# Patient Record
Sex: Male | Born: 1990 | Race: White | Hispanic: No | Marital: Single | Smoking: Former smoker
Health system: Southern US, Community
[De-identification: ages and names within clinical notes are randomized; demographics above are authoritative.]

## PROBLEM LIST (undated history)

## (undated) DIAGNOSIS — J45909 Unspecified asthma, uncomplicated: Secondary | ICD-10-CM

---
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
## 2013-04-11 ENCOUNTER — Encounter (HOSPITAL_COMMUNITY): Payer: Self-pay

## 2013-04-11 ENCOUNTER — Emergency Department (HOSPITAL_COMMUNITY)
Admission: EM | Admit: 2013-04-11 | Discharge: 2013-04-11 | Disposition: A | Discharge status: Home / Self Care | Payer: Medicaid Other | Attending: Emergency Medicine | Admitting: Emergency Medicine

## 2013-04-11 DIAGNOSIS — R51 Headache: Secondary | ICD-10-CM | POA: Insufficient documentation

## 2013-04-11 DIAGNOSIS — Z5900 Homelessness unspecified: Secondary | ICD-10-CM | POA: Insufficient documentation

## 2013-04-11 DIAGNOSIS — Y9389 Activity, other specified: Secondary | ICD-10-CM | POA: Insufficient documentation

## 2013-04-11 DIAGNOSIS — S90569A Insect bite (nonvenomous), unspecified ankle, initial encounter: Secondary | ICD-10-CM | POA: Insufficient documentation

## 2013-04-11 DIAGNOSIS — Y929 Unspecified place or not applicable: Secondary | ICD-10-CM | POA: Insufficient documentation

## 2013-04-11 DIAGNOSIS — J45909 Unspecified asthma, uncomplicated: Secondary | ICD-10-CM | POA: Insufficient documentation

## 2013-04-11 DIAGNOSIS — H538 Other visual disturbances: Secondary | ICD-10-CM | POA: Insufficient documentation

## 2013-04-11 DIAGNOSIS — R110 Nausea: Secondary | ICD-10-CM | POA: Insufficient documentation

## 2013-04-11 DIAGNOSIS — R42 Dizziness and giddiness: Secondary | ICD-10-CM | POA: Insufficient documentation

## 2013-04-11 HISTORY — DX: Unspecified asthma, uncomplicated: J45.909

## 2013-04-11 MED ORDER — METOCLOPRAMIDE HCL 5 MG/ML IJ SOLN
10.0000 mg | Freq: Once | INTRAMUSCULAR | Status: AC
  Administered 2013-04-11: 10 mg via INTRAMUSCULAR
  Filled 2013-04-11: qty 2

## 2013-04-11 MED ORDER — DIPHENHYDRAMINE HCL 50 MG/ML IJ SOLN
25.0000 mg | Freq: Once | INTRAMUSCULAR | Status: AC
  Administered 2013-04-11: 25 mg via INTRAMUSCULAR
  Filled 2013-04-11: qty 1

## 2013-04-11 NOTE — ED Provider Notes (Signed)
Medical screening examination/treatment/procedure(s) were performed by non-physician practitioner and as supervising physician I was immediately available for consultation/collaboration.  Layla Maw Jobany Montellano, DO 04/11/13 2233

## 2013-04-11 NOTE — ED Provider Notes (Signed)
CSN: [id number]     Arrival date & time 04/11/13  1818 History     First MD Initiated Contact with Patient 04/11/13 1920     Chief Complaint  Patient presents with  . Headache  . Insect Bite   (Consider location/radiation/quality/duration/timing/severity/associated sxs/prior Treatment) HPI Comments: Patient is a 22 year old male presented to the emergency department for multiple complaints. Patient's first complaint is a headache that has been getting progressively worse over the last 2 days. He states it is throbbing in nature on the left side with no alleviating or hurting factors. Patient endorses associated blurred vision and nausea. Patient states he has not had anything to eat or drink the last 2 days and is homeless. Patient states he does typically get headaches with these associated symptoms but not as severe. Patient's second complaint is multiple bug bites on his lower extremities. He states they're itchy without drainage or warmth. He believes that they are mosquito bites but is unsure. Patient denies fevers or chills, syncope, numbness or tingling.  Patient is a 22 y.o. male presenting with headaches.  Headache Associated symptoms: no fever, no neck pain, no neck stiffness and no numbness     Past Medical History  Diagnosis Date  . Asthma    History reviewed. No pertinent past surgical history. No family history on file. History  Substance Use Topics  . Smoking status: Not on file  . Smokeless tobacco: Not on file  . Alcohol Use: Not on file    Review of Systems  Constitutional: Negative for fever and chills.  HENT: Negative for facial swelling, neck pain and neck stiffness.   Respiratory: Negative for shortness of breath.   Cardiovascular: Negative for chest pain.  Genitourinary: Negative.   Musculoskeletal: Negative.   Skin:       Bug bites  Neurological: Positive for light-headedness and headaches. Negative for syncope, weakness and numbness.    Allergies   Dye fdc red  Home Medications  No current outpatient prescriptions on file. BP 121/69  Pulse 74  Temp(Src) 99.1 F (37.3 C) (Oral)  Resp 16  SpO2 99% Physical Exam  Constitutional: He is oriented to person, place, and time. He appears well-developed and well-nourished. No distress.  Patient sleeping upon my entrance into the room.  HENT:  Head: Normocephalic and atraumatic.  Right Ear: External ear normal.  Left Ear: External ear normal.  Nose: Nose normal.  Mouth/Throat: Oropharynx is clear and moist.  Eyes: Conjunctivae and EOM are normal. Pupils are equal, round, and reactive to light.  Neck: Normal range of motion. Neck supple.  Cardiovascular: Normal rate, regular rhythm, normal heart sounds and intact distal pulses.   Pulmonary/Chest: Effort normal and breath sounds normal. No respiratory distress.  Abdominal: Soft. There is no tenderness.  Musculoskeletal: Normal range of motion.  Lymphadenopathy:    He has no cervical adenopathy.  Neurological: He is alert and oriented to person, place, and time. He has normal strength. No cranial nerve deficit or sensory deficit. Gait normal. GCS eye subscore is 4. GCS verbal subscore is 5. GCS motor subscore is 6.  Skin: Skin is warm and dry. He is not diaphoretic.  Multiple erythematous spots on LE consistent with bug bites, no warmth or surrounding erythema. No drainage    ED Course   Procedures (including critical care time)  Medications  metoCLOPramide (REGLAN) injection 10 mg (10 mg Intramuscular Given 04/11/13 1939)  diphenhydrAMINE (BENADRYL) injection 25 mg (25 mg Intramuscular Given 04/11/13 1939)  Labs Reviewed - No data to display No results found. 1. Headache     MDM  NAD, non-toxic AAOx4. Presentation non concerning for [facility], ICH, Meningitis, or temporal arteritis. Pt is afebrile with no focal neuro deficits, nuchal rigidity, or change in vision. Pt given Benadryl and Reglan for HA. Benadryl also used for  pruritic bug bites, areas non-concerning for cellulitis or acute emergent rash. Pt just given HA cocktail at shift change. Care transferred to [facility] for follow up. Patient stable at time of shift change.   Jeannetta Ellis, PA-C 04/12/13 1512

## 2013-04-11 NOTE — ED Notes (Signed)
Pt has bug bites to ankles on both legs. Pt states he thinks they are mosquito bites. Pt also states he has a headache and slight nausea for the past 2 days. Pt denies vomiting. Pt a/o x 4. Pt with no acute distress. Pt ambulatory to exam room with steady gait.

## 2013-04-11 NOTE — ED Notes (Signed)
Per EMS pt c/o headache and a bug bite, pt homeless sleeps in woods; no distress

## 2013-04-11 NOTE — ED Provider Notes (Signed)
Pt received from Piepenbrink, PA-C.  21yo healthy but homeless 21yo M presented to ED w/ c/o pruritic insect bites, that appear to be from mosquitoes, and non-traumatic headache x 2d.  He received IM reglan and benadryl and reports that his symptoms are much improved.  Will d/c home with resources for homeless shelters.  Return precautions discussed.  9:11 PM   Otilio Miu, PA-C 04/11/13 2112

## 2013-04-12 NOTE — ED Provider Notes (Signed)
Medical screening examination/treatment/procedure(s) were performed by non-physician practitioner and as supervising physician I was immediately available for consultation/collaboration.  Maryanna Stuber N Paolina Karwowski, DO 04/12/13 2339 

## 2019-05-06 ENCOUNTER — Encounter (HOSPITAL_COMMUNITY): Payer: Self-pay | Admitting: Emergency Medicine

## 2019-05-06 ENCOUNTER — Other Ambulatory Visit: Payer: Self-pay

## 2019-05-06 ENCOUNTER — Emergency Department (HOSPITAL_COMMUNITY): Payer: Self-pay

## 2019-05-06 ENCOUNTER — Emergency Department (HOSPITAL_COMMUNITY)
Admission: EM | Admit: 2019-05-06 | Discharge: 2019-05-07 | Disposition: A | Discharge status: Home / Self Care | Payer: Self-pay | Attending: Emergency Medicine | Admitting: Emergency Medicine

## 2019-05-06 DIAGNOSIS — J45909 Unspecified asthma, uncomplicated: Secondary | ICD-10-CM | POA: Insufficient documentation

## 2019-05-06 DIAGNOSIS — R112 Nausea with vomiting, unspecified: Secondary | ICD-10-CM | POA: Insufficient documentation

## 2019-05-06 DIAGNOSIS — R079 Chest pain, unspecified: Secondary | ICD-10-CM | POA: Insufficient documentation

## 2019-05-06 DIAGNOSIS — R202 Paresthesia of skin: Secondary | ICD-10-CM | POA: Insufficient documentation

## 2019-05-06 DIAGNOSIS — Z87891 Personal history of nicotine dependence: Secondary | ICD-10-CM | POA: Insufficient documentation

## 2019-05-06 LAB — URINALYSIS, ROUTINE W REFLEX MICROSCOPIC
Bilirubin Urine: NEGATIVE
Glucose, UA: NEGATIVE mg/dL
Hgb urine dipstick: NEGATIVE
Ketones, ur: NEGATIVE mg/dL
Leukocytes,Ua: NEGATIVE
Nitrite: NEGATIVE
Protein, ur: NEGATIVE mg/dL
Specific Gravity, Urine: 1.003 — ABNORMAL LOW (ref 1.005–1.030)
pH: 6 (ref 5.0–8.0)

## 2019-05-06 LAB — COMPREHENSIVE METABOLIC PANEL
ALT: 47 U/L — ABNORMAL HIGH (ref 0–44)
AST: 29 U/L (ref 15–41)
Albumin: 4.4 g/dL (ref 3.5–5.0)
Alkaline Phosphatase: 90 U/L (ref 38–126)
Anion gap: 9 (ref 5–15)
BUN: 5 mg/dL — ABNORMAL LOW (ref 6–20)
CO2: 24 mmol/L (ref 22–32)
Calcium: 9.3 mg/dL (ref 8.9–10.3)
Chloride: 101 mmol/L (ref 98–111)
Creatinine, Ser: 0.94 mg/dL (ref 0.61–1.24)
GFR calc Af Amer: >60 mL/min (ref >60)
GFR calc non Af Amer: >60 mL/min (ref >60)
Glucose, Bld: 113 mg/dL — ABNORMAL HIGH (ref 70–99)
Potassium: 3.4 mmol/L — ABNORMAL LOW (ref 3.5–5.1)
Sodium: 134 mmol/L — ABNORMAL LOW (ref 135–145)
Total Bilirubin: 0.6 mg/dL (ref 0.3–1.2)
Total Protein: 7.3 g/dL (ref 6.5–8.1)

## 2019-05-06 LAB — CBC
HCT: 48.4 % (ref 39.0–52.0)
Hemoglobin: 15.9 g/dL (ref 13.0–17.0)
MCH: 27.9 pg (ref 26.0–34.0)
MCHC: 32.9 g/dL (ref 30.0–36.0)
MCV: 84.9 fL (ref 80.0–100.0)
Platelets: 344 10*3/uL (ref 150–400)
RBC: 5.7 MIL/uL (ref 4.22–5.81)
RDW: 12.5 % (ref 11.5–15.5)
WBC: 16.9 10*3/uL — ABNORMAL HIGH (ref 4.0–10.5)
nRBC: 0 % (ref 0.0–0.2)

## 2019-05-06 LAB — TROPONIN I (HIGH SENSITIVITY)
Troponin I (High Sensitivity): 4 ng/L (ref <18)
Troponin I (High Sensitivity): 3 ng/L (ref <18)

## 2019-05-06 LAB — LIPASE, BLOOD: Lipase: 24 U/L (ref 11–51)

## 2019-05-06 LAB — D-DIMER, QUANTITATIVE: D-Dimer, Quant: <0.27 ug/mL-FEU (ref 0.00–0.50)

## 2019-05-06 MED ORDER — SODIUM CHLORIDE 0.9 % IV BOLUS
1000.0000 mL | Freq: Once | INTRAVENOUS | Status: AC
  Administered 2019-05-06: 1000 mL via INTRAVENOUS

## 2019-05-06 MED ORDER — SODIUM CHLORIDE 0.9% FLUSH: 3.0000 mL | Freq: Once | INTRAVENOUS | Status: DC

## 2019-05-06 NOTE — ED Provider Notes (Signed)
[facility]   CSN: [id number] Arrival date & time: 05/06/19  2021     History   Chief Complaint Chief Complaint  Patient presents with  . Chest Pain  . Abdominal Pain    HPI Nicholas Chang is a 28 y.o. male.   HPI   28yM with CP. Onset this afternoon while at rest. Pain in the center of his chest. Doesn't radiate. Improved since onset but not completely resolved. Associated with n/v and tingling in both hands. No cough. No fever or chills. No unusual leg pain or swelling.   Past Medical History:  Diagnosis Date  . Asthma     There are no active problems to display for this patient.   History reviewed. No pertinent surgical history.    Home Medications    Prior to Admission medications   Medication Sig Start Date End Date Taking? Authorizing Provider  naproxen sodium (ALEVE) 220 MG tablet Take 440 mg by mouth 3 (three) times daily as needed (pain).   Yes [provider]   Family History No family history on file.  Social History Social History   Tobacco Use  . Smoking status: Former Games developermoker  . Smokeless tobacco: Never Used  Substance Use Topics  . Alcohol use: Never    Frequency: Never  . Drug use: Never   Allergies   Dye fdc red [red dye] and Ginger  Review of Systems Review of Systems  All systems reviewed and negative, other than as noted in HPI.  Physical Exam Updated Vital Signs BP 116/81 (BP Location: Right Arm)   Pulse 98   Temp 98.6 F (37 C) (Oral)   Resp 16   Ht 5\' 11"  (1.803 m)   Wt 104.3 kg   SpO2 100%   BMI 32.08 kg/m   Physical Exam Vitals signs and nursing note reviewed.  Constitutional:      General: He is not in acute distress.    Appearance: He is well-developed. He is obese.  HENT:     Head: Normocephalic and atraumatic.  Eyes:     General:        Right eye: No discharge.        Left eye: No discharge.     Conjunctiva/sclera: Conjunctivae normal.   Neck:     Musculoskeletal: Neck supple.  Cardiovascular:     Rate and Rhythm: Normal rate and regular rhythm.     Heart sounds: Normal heart sounds. No murmur. No friction rub. No gallop.   Pulmonary:     Effort: Pulmonary effort is normal. No respiratory distress.     Breath sounds: Normal breath sounds.  Abdominal:     General: There is no distension.     Palpations: Abdomen is soft.     Tenderness: There is no abdominal tenderness.  Musculoskeletal:        General: No tenderness.     Comments: Lower extremities symmetric as compared to each other. No calf tenderness. Negative Homan's. No palpable cords.   Skin:    General: Skin is warm and dry.  Neurological:     Mental Status: He is alert.  Psychiatric:        Behavior: Behavior normal.        Thought Content: Thought content normal.    ED Treatments / Results  Labs (all labs ordered are listed, but only abnormal results are displayed) Labs Reviewed  COMPREHENSIVE METABOLIC PANEL - Abnormal; Notable for the following components:  Result Value   Sodium 134 (*)    Potassium 3.4 (*)    Glucose, Bld 113 (*)    BUN 5 (*)    ALT 47 (*)    All other components within normal limits  CBC - Abnormal; Notable for the following components:   WBC 16.9 (*)    All other components within normal limits  URINALYSIS, ROUTINE W REFLEX MICROSCOPIC - Abnormal; Notable for the following components:   Color, Urine STRAW (*)    Specific Gravity, Urine 1.003 (*)    All other components within normal limits  LIPASE, BLOOD  D-DIMER, QUANTITATIVE (NOT AT [facility])  TROPONIN I (HIGH SENSITIVITY)  TROPONIN I (HIGH SENSITIVITY)    EKG EKG Interpretation  Date/Time:  Sunday May 06 2019 21:50:19 EDT Ventricular Rate:  101 PR Interval:    QRS Duration: 89 QT Interval:  339 QTC Calculation: 440 R Axis:   76 Text Interpretation:  Sinus tachycardia Confirmed by Tyteanna Ost ([id number]) on 05/06/2019 10:27:40 PM   Radiology Dg Chest  2 View  Result Date: 05/06/2019 CLINICAL DATA:  Chest pain EXAM: CHEST - 2 VIEW COMPARISON:  02/18/2008 FINDINGS: The heart size and mediastinal contours are within normal limits. Both lungs are clear. The visualized skeletal structures are unremarkable. IMPRESSION: No active cardiopulmonary disease. Electronically Signed   By: Kim  Fujinaga M.D.   On: 05/06/2019 20:41   Procedures Procedures (including critical care time)  Medications Ordered in ED Medications  sodium chloride flush (NS) 0.9 % injection 3 mL (has no administration in time range)  sodium chloride 0.9 % bolus 1,000 mL (1,000 mLs Intravenous New Bag/Given 05/06/19 2254)    Initial Impression / Assessment and Plan / ED Course  I have reviewed the triage vital signs and the nursing notes.  Pertinent labs & imaging results that were available during my care of the patient were reviewed by me and considered in my medical decision making (see chart for details).     28 yM with CP. Seems atypical for ACS. Suspect may be some component of anxiety. Resting tachycardia. Will obtain d-dimer. CTa if positive. Otherwise I doubt emergent process.   Final Clinical Impressions(s) / ED Diagnoses   Final diagnoses:  Chest pain, unspecified type    ED Discharge Orders    None       Virgel Manifold, MD 05/08/19 [phone]

## 2019-05-06 NOTE — ED Notes (Signed)
Pt ambulated to restroom with no issues.  

## 2019-05-06 NOTE — ED Triage Notes (Addendum)
Pt to triage via [facility]> C/o substernal chest pressure, hands tingling, nausea, vomiting, and diarrhea x 6 hours.  Pain seemed to move more into abd upon arrival to ED.  Denies fever.  No change in pain with movement or palpation.  Zofran 4mg  IV, ASA 324mg , NTG SL x 1 that changed pain from a 6 to 3.  NS 250 bolus.

## 2019-05-06 NOTE — ED Notes (Signed)
ED Provider at bedside. 

## 2019-05-07 NOTE — ED Notes (Signed)
ED Provider at bedside. 

## 2019-05-07 NOTE — ED Provider Notes (Signed)
Patient signed out to me by Dr. Wilson Singer to follow-up on d-dimer.  Patient seen with chest and abdominal discomfort earlier that was felt to be likely secondary to anxiety.  His cardiac evaluation has been negative.  His d-dimer was also normal.  He is no longer tachycardic, heart rate is now in the 90s with normal blood pressure.  Patient does admit to a history of anxiety and panic attack.  Additionally he drank 3 cups of coffee this morning and had not had any caffeine or coffee for a very long time prior to that.  He thinks that it caused him to have some reflux and palpitations that caused his anxiety.  He is feeling back to his normal baseline now, appropriate for discharge.   Orpah Greek, MD 05/07/19 [phone]

## 2019-10-29 ENCOUNTER — Encounter (HOSPITAL_BASED_OUTPATIENT_CLINIC_OR_DEPARTMENT_OTHER): Payer: Self-pay | Admitting: *Deleted

## 2019-10-29 ENCOUNTER — Emergency Department (HOSPITAL_BASED_OUTPATIENT_CLINIC_OR_DEPARTMENT_OTHER)
Admission: EM | Admit: 2019-10-29 | Discharge: 2019-10-29 | Disposition: A | Discharge status: Home / Self Care | Payer: Self-pay | Attending: Emergency Medicine | Admitting: Emergency Medicine

## 2019-10-29 ENCOUNTER — Other Ambulatory Visit: Payer: Self-pay

## 2019-10-29 DIAGNOSIS — J45909 Unspecified asthma, uncomplicated: Secondary | ICD-10-CM | POA: Insufficient documentation

## 2019-10-29 DIAGNOSIS — K029 Dental caries, unspecified: Secondary | ICD-10-CM | POA: Insufficient documentation

## 2019-10-29 DIAGNOSIS — K0889 Other specified disorders of teeth and supporting structures: Secondary | ICD-10-CM | POA: Insufficient documentation

## 2019-10-29 DIAGNOSIS — Z87891 Personal history of nicotine dependence: Secondary | ICD-10-CM | POA: Insufficient documentation

## 2019-10-29 DIAGNOSIS — Z91018 Allergy to other foods: Secondary | ICD-10-CM | POA: Insufficient documentation

## 2019-10-29 MED ORDER — PENICILLIN V POTASSIUM 500 MG PO TABS: 500.0000 mg | ORAL_TABLET | Freq: Four times a day (QID) | ORAL | 0 refills | Status: AC

## 2019-10-29 NOTE — ED Provider Notes (Signed)
[facility] EMERGENCY DEPARTMENT Provider Note   CSN: [id number] Arrival date & time: 10/29/19  1515     History Chief Complaint  Patient presents with  . Dental Pain    Nicholas Chang is a 29 y.o. male.  HPI 29 year old Caucasian male with no pertinent past medical history presents to the ER for evaluation of dental pain.  Patient reports pain to the upper right side of his mouth for the past several days.  Patient states he does not have a dentist due to insurance.  Patient denies any difficulties breathing or swallowing.  No fevers or chills.  He has taken over-the-counter ibuprofen and Tylenol for pain without any significant relief.  Patient has also taken Orajel without any relief.  No alleviating or aggravating factors.    Past Medical History:  Diagnosis Date  . Asthma     There are no problems to display for this patient.   No past surgical history on file.     No family history on file.  Social History   Tobacco Use  . Smoking status: Former Research scientist (life sciences)  . Smokeless tobacco: Never Used  Substance Use Topics  . Alcohol use: Yes  . Drug use: Never    Home Medications Prior to Admission medications   Medication Sig Start Date End Date Taking? Authorizing Provider  naproxen sodium (ALEVE) 220 MG tablet Take 440 mg by mouth 3 (three) times daily as needed (pain).    [provider]  penicillin v potassium (VEETID) 500 MG tablet Take 1 tablet (500 mg total) by mouth 4 (four) times daily for 7 days. 10/29/19 11/05/19  Doristine Devoid, PA-C    Allergies    Dye fdc red [red dye] and Ginger  Review of Systems   Review of Systems  Constitutional: Negative for chills and fever.  HENT: Positive for dental problem. Negative for trouble swallowing.   Eyes: Negative for discharge.  Respiratory: Negative for cough.   Neurological: Negative for headaches.  Psychiatric/Behavioral: Negative for confusion.    Physical Exam Updated Vital Signs BP  (!) 147/81 (BP Location: Left Arm)   Pulse 82   Temp 98.7 F (37.1 C) (Oral)   Resp 19   Ht 5\' 11"  (1.803 m)   Wt 104.3 kg   SpO2 100%   BMI 32.08 kg/m   Physical Exam Vitals and nursing note reviewed.  Constitutional:      General: He is not in acute distress.    Appearance: He is well-developed.  HENT:     Head: Normocephalic and atraumatic.     Mouth/Throat:     Mouth: Mucous membranes are moist.     Pharynx: Oropharynx is clear. No posterior oropharyngeal erythema.     Comments: Poor dentition throughout with several dental caries.  Patient does have dental carry to the back right upper molar.  There is no gross abscess for drainage.  The gingiva is mildly irritated and edematous but no erythema.  There is no associated drainage appreciated.  Patient oropharynx is clear.  Uvula midline.  No sublingual or submandibular swelling.  No muffled voice or drooling. Eyes:     General: No scleral icterus.       Right eye: No discharge.        Left eye: No discharge.  Pulmonary:     Effort: No respiratory distress.  Musculoskeletal:        General: Normal range of motion.     Cervical back: Normal range of motion.  Lymphadenopathy:     Cervical: No cervical adenopathy.  Skin:    Coloration: Skin is not pale.  Neurological:     Mental Status: He is alert.  Psychiatric:        Behavior: Behavior normal.        Thought Content: Thought content normal.        Judgment: Judgment normal.     ED Results / Procedures / Treatments   Labs (all labs ordered are listed, but only abnormal results are displayed) Labs Reviewed - No data to display  EKG None  Radiology No results found.  Procedures Dental Block  Date/Time: 10/29/2019 5:49 PM Performed by: Rise Mu, PA-C Authorized by: Rise Mu, PA-C   Consent:    Consent obtained:  Verbal   Consent given by:  Patient   Alternatives discussed:  No treatment Indications:    Indications: dental pain     Procedure details (see MAR for exact dosages):    Needle gauge:  27 G   Anesthetic injected:  Bupivacaine 0.25% WITH epi   Injection procedure:  Anatomic landmarks identified, introduced needle, incremental injection, negative aspiration for blood and anatomic landmarks palpated Post-procedure details:    Outcome:  Pain relieved   Patient tolerance of procedure:  Tolerated well, no immediate complications   (including critical care time)  Medications Ordered in ED Medications - No data to display  ED Course  I have reviewed the triage vital signs and the nursing notes.  Pertinent labs & imaging results that were available during my care of the patient were reviewed by me and considered in my medical decision making (see chart for details).    MDM Rules/Calculators/A&P                      Patient with toothache.  No gross abscess.  Exam unconcerning for Ludwig's angina or spread of infection.  Will treat with penicillin and pain medicine.  Urged patient to follow-up with dentist.    Final Clinical Impression(s) / ED Diagnoses Final diagnoses:  Pain, dental    Rx / DC Orders ED Discharge Orders         Ordered    penicillin v potassium (VEETID) 500 MG tablet  4 times daily     10/29/19 1640           Rise Mu, PA-C 10/29/19 1750    Pricilla Loveless, MD 11/01/19 1547

## 2019-10-29 NOTE — ED Triage Notes (Signed)
Pt c/o of upper right side dental pain, hx of dental issues due to lack of dental care.

## 2019-10-29 NOTE — Discharge Instructions (Addendum)

## 2019-10-29 NOTE — ED Notes (Signed)
Rt back top tooth has  Been hurting and broken  Longer than 2 months pt states , , has other teeth that are bad

## 2019-10-29 NOTE — ED Notes (Signed)
Pt has taken naproxen/tylenol q 6 hrs and 1 tube of orael daily x 1 week

## 2019-11-23 IMAGING — DX DG CHEST 2V
2 series · 2 of 2 positions shown · non-contrast
Comparison: 02/18/2008

CLINICAL DATA: Chest pain

EXAM:
CHEST - 2 VIEW

[chest pa]
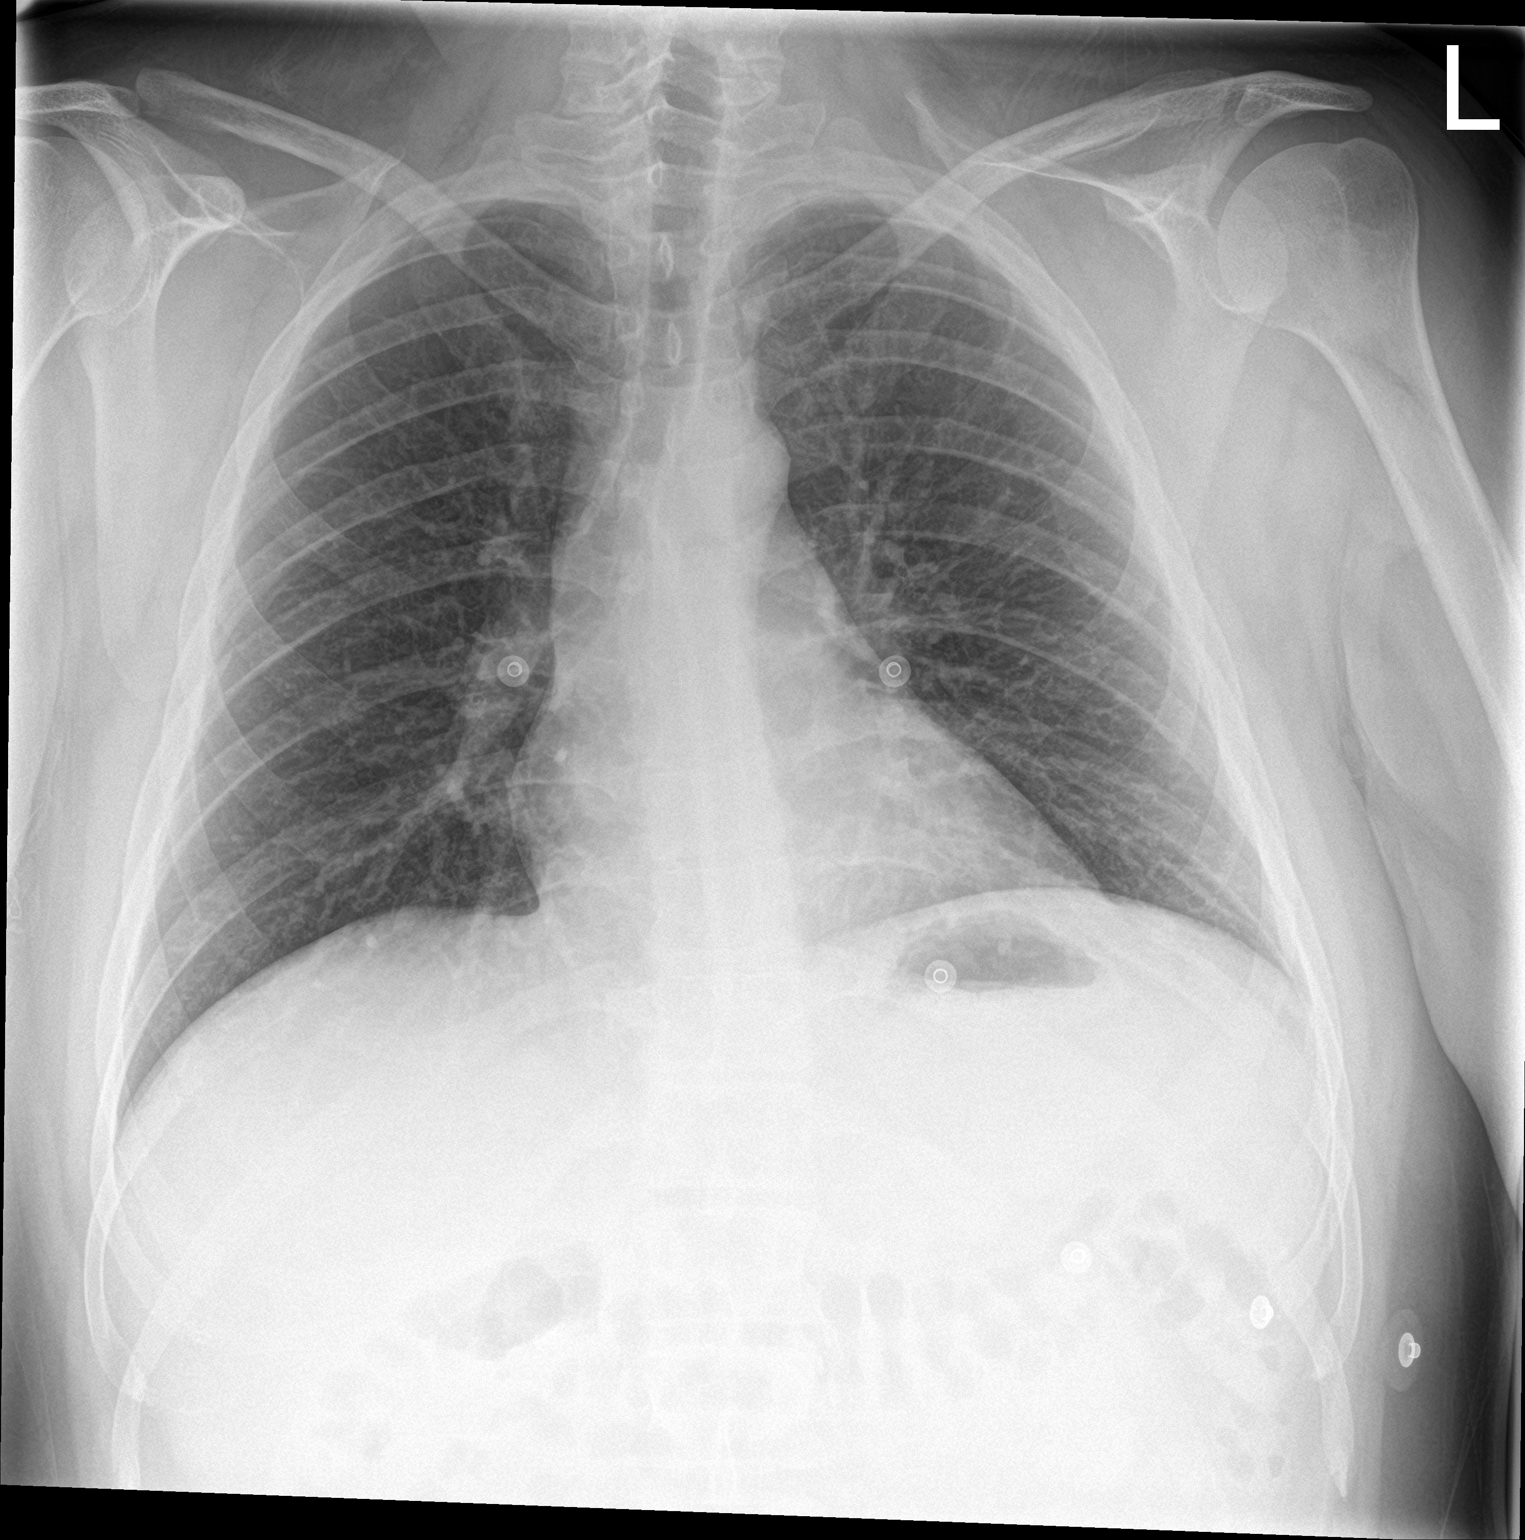

[chest lat]
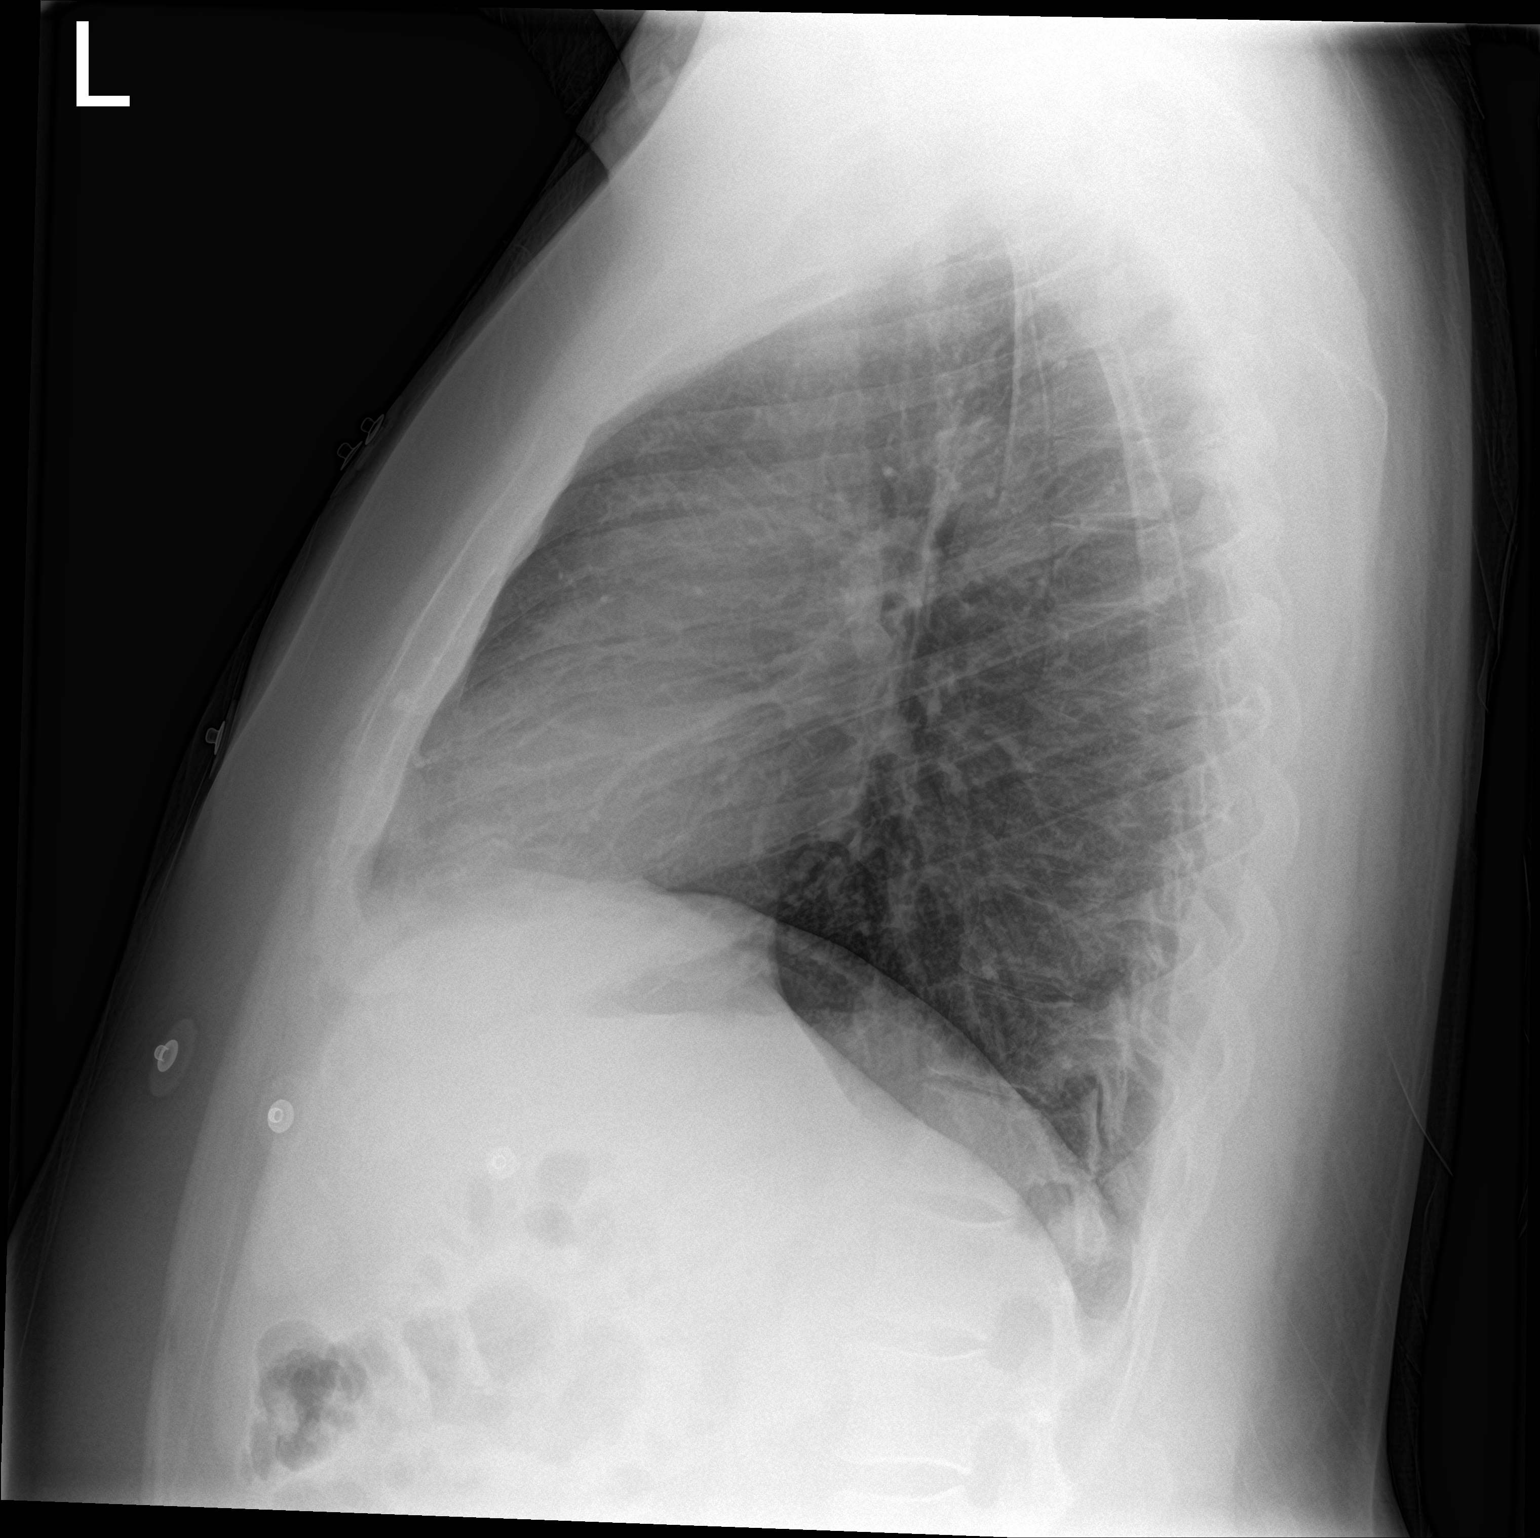

[2 of 2 positions shown; findings below may reference images not displayed]

FINDINGS: The heart size and mediastinal contours are within normal limits.
Both lungs are clear. The visualized skeletal structures are
unremarkable.
IMPRESSION: No active cardiopulmonary disease.
